# Patient Record
Sex: Female | Born: 1995 | Race: Black or African American | Hispanic: No | Marital: Single | Smoking: Never smoker
Health system: Southern US, Community
[De-identification: ages and names within clinical notes are randomized; demographics above are authoritative.]

---

## 2019-12-28 ENCOUNTER — Other Ambulatory Visit: Payer: Self-pay

## 2019-12-28 ENCOUNTER — Emergency Department (HOSPITAL_BASED_OUTPATIENT_CLINIC_OR_DEPARTMENT_OTHER)
Admission: EM | Admit: 2019-12-28 | Discharge: 2019-12-28 | Disposition: A | Discharge status: Home / Self Care | Payer: Medicaid Other | Attending: Emergency Medicine | Admitting: Emergency Medicine

## 2019-12-28 ENCOUNTER — Emergency Department (HOSPITAL_BASED_OUTPATIENT_CLINIC_OR_DEPARTMENT_OTHER): Payer: Medicaid Other

## 2019-12-28 ENCOUNTER — Encounter (HOSPITAL_BASED_OUTPATIENT_CLINIC_OR_DEPARTMENT_OTHER): Payer: Self-pay | Admitting: Emergency Medicine

## 2019-12-28 DIAGNOSIS — S6991XA Unspecified injury of right wrist, hand and finger(s), initial encounter: Secondary | ICD-10-CM | POA: Insufficient documentation

## 2019-12-28 DIAGNOSIS — Y9389 Activity, other specified: Secondary | ICD-10-CM | POA: Diagnosis not present

## 2019-12-28 DIAGNOSIS — Y929 Unspecified place or not applicable: Secondary | ICD-10-CM | POA: Diagnosis not present

## 2019-12-28 DIAGNOSIS — Y998 Other external cause status: Secondary | ICD-10-CM | POA: Diagnosis not present

## 2019-12-28 MED ORDER — ACETAMINOPHEN 500 MG PO TABS
1000.0000 mg | ORAL_TABLET | Freq: Once | ORAL | Status: AC
  Administered 2019-12-28: 1000 mg via ORAL
  Filled 2019-12-28: qty 2

## 2019-12-28 MED ORDER — IBUPROFEN 800 MG PO TABS
800.0000 mg | ORAL_TABLET | Freq: Once | ORAL | Status: AC
  Administered 2019-12-28: 14:00:00 800 mg via ORAL
  Filled 2019-12-28: qty 1
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
## 2019-12-28 NOTE — Discharge Instructions (Signed)
Take 4 over the counter ibuprofen tablets 3 times a day or 2 over-the-counter naproxen tablets twice a day for pain. Also take tylenol 1000mg (2 extra strength) four times a day.   Use the splint as needed for support.  Please follow-up with your family doctor.  Return for redness drainage or fever.
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
